# Patient Record
Sex: Male | Born: 1985 | Race: White | Hispanic: No | Marital: Single | Smoking: Current every day smoker
Health system: Southern US, Community
[De-identification: ages and names within clinical notes are randomized; demographics above are authoritative.]

---
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
## 2007-06-05 ENCOUNTER — Emergency Department (HOSPITAL_COMMUNITY): Admission: EM | Admit: 2007-06-05 | Discharge: 2007-06-05 | Payer: Self-pay | Admitting: Emergency Medicine

## 2008-04-27 ENCOUNTER — Emergency Department (HOSPITAL_COMMUNITY): Admission: EM | Admit: 2008-04-27 | Discharge: 2008-04-27 | Payer: Self-pay | Admitting: Emergency Medicine

## 2011-11-15 ENCOUNTER — Encounter (HOSPITAL_COMMUNITY): Payer: Self-pay | Admitting: Emergency Medicine

## 2011-11-15 ENCOUNTER — Emergency Department (HOSPITAL_COMMUNITY): Payer: Self-pay

## 2011-11-15 ENCOUNTER — Emergency Department (HOSPITAL_COMMUNITY)
Admission: EM | Admit: 2011-11-15 | Discharge: 2011-11-15 | Disposition: A | Discharge status: Home / Self Care | Payer: Self-pay | Attending: Emergency Medicine | Admitting: Emergency Medicine

## 2011-11-15 DIAGNOSIS — M5416 Radiculopathy, lumbar region: Secondary | ICD-10-CM

## 2011-11-15 DIAGNOSIS — IMO0002 Reserved for concepts with insufficient information to code with codable children: Secondary | ICD-10-CM | POA: Insufficient documentation

## 2011-11-15 DIAGNOSIS — M25559 Pain in unspecified hip: Secondary | ICD-10-CM | POA: Insufficient documentation

## 2011-11-15 MED ORDER — CYCLOBENZAPRINE HCL 10 MG PO TABS: 10.0000 mg | ORAL_TABLET | Freq: Two times a day (BID) | ORAL | Status: AC | PRN

## 2011-11-15 MED ORDER — HYDROCODONE-ACETAMINOPHEN 5-500 MG PO TABS: 1.0000 | ORAL_TABLET | Freq: Four times a day (QID) | ORAL | Status: AC | PRN

## 2011-11-15 MED ORDER — CYCLOBENZAPRINE HCL 10 MG PO TABS
10.0000 mg | ORAL_TABLET | Freq: Once | ORAL | Status: AC
  Administered 2011-11-15: 10 mg via ORAL
  Filled 2011-11-15: qty 1

## 2011-11-15 MED ORDER — KETOROLAC TROMETHAMINE 60 MG/2ML IM SOLN
60.0000 mg | Freq: Once | INTRAMUSCULAR | Status: AC
  Administered 2011-11-15: 60 mg via INTRAMUSCULAR
  Filled 2011-11-15: qty 2

## 2011-11-15 NOTE — ED Provider Notes (Addendum)
History   This chart was scribed for Gwyneth Sprout, MD by Brooks Sailors. The patient was seen in room STRE2/STRE2. Patient's care was started at 1144.   CSN: [id number]  Arrival date & time 11/15/11  1144   First MD Initiated Contact with Patient 11/15/11 1154      Chief Complaint  Patient presents with  . Back Pain    (Consider location/radiation/quality/duration/timing/severity/associated sxs/prior treatment) HPI  Rick Ortega is a 26 y.o. male who presents to the Emergency Department complaining of constant severe right side back pain onset 45 minutes ago. Pt says he was moving mattress over his shoulders when he felt a shooting pain from his right lower back down to his right toes. Course is constant. Nothing makes pain worse and nothing makes pain better. Denies GU/GI problems, abdominal pain. Denies history of back problems and has not taken anything for pain.    History reviewed. No pertinent past medical history.  History reviewed. No pertinent past surgical history.  History reviewed. No pertinent family history.  History  Substance Use Topics  . Smoking status: Current Everyday Smoker  . Smokeless tobacco: Not on file  . Alcohol Use: Yes     occasional      Review of Systems  Musculoskeletal: Positive for back pain.  All other systems reviewed and are negative.    Allergies  Review of patient's allergies indicates no known allergies.  Home Medications  No current outpatient prescriptions on file.  BP 157/83  Pulse 97  Temp(Src) 98.2 F (36.8 C) (Oral)  Resp 18  Wt 160 lb (72.576 kg)  SpO2 100%  Physical Exam  Nursing note and vitals reviewed. Constitutional: He is oriented to person, place, and time. He appears well-developed and well-nourished. No distress.  HENT:  Head: Normocephalic and atraumatic.  Eyes: EOM are normal. Pupils are equal, round, and reactive to light.  Neck: Neck supple. No tracheal deviation present.    Cardiovascular: Normal rate and normal heart sounds.   Pulmonary/Chest: Effort normal. No respiratory distress. He has no wheezes.  Abdominal: Soft. Bowel sounds are normal. There is no tenderness.  Musculoskeletal: Normal range of motion. He exhibits tenderness.       Severe tenderness lumbar region, Normal strength, normal reflexes and pulses, Full ROM in both lower extremities without pain.  Neurological: He is alert and oriented to person, place, and time. He has normal strength and normal reflexes. No sensory deficit.  Skin: Skin is warm and dry.  Psychiatric: He has a normal mood and affect. His behavior is normal.    ED Course  Procedures (including critical care time)  Pt seen at 1157. Pt to have muscle relaxer and pain medication.   Labs Reviewed - No data to display Dg Lumbar Spine Complete  11/15/2011  *RADIOLOGY REPORT*  Clinical Data: Back pain.  Left hip pain.  LUMBAR SPINE - COMPLETE 4+ VIEW  Comparison: None.  Findings: Lumbar vertebral alignment appears normal.  No lumbar spine fracture or acute subluxation is identified.  No acute lumbar spine findings noted.  IMPRESSION:  No significant abnormality identified.  Original Report Authenticated By: Dellia Cloud, M.D.     1. Lumbar radiculopathy       MDM   Pt with abrupt onset of back pain suggestive of radiculopathy down the left leg that started while he was lifting a mattress.  No neurovascular compromise and no incontinence.  Pt has no infectious sx, hx of CA  or other red flags  concerning for pathologic back pain.  Pt is able to ambulate but is painful.  Normal strength and reflexes on exam.  No pain with ROM of the legs.  Plain film wnl. Will give pt pain control and to return for developement of above sx.      I personally performed the services described in this documentation, which was scribed in my presence.  The recorded information has been reviewed and considered.    Gwyneth Sprout,  MD 11/15/11 1205  Gwyneth Sprout, MD 11/15/11 1300  Gwyneth Sprout, MD 11/15/11 1327  Gwyneth Sprout, MD 11/15/11 1329  Gwyneth Sprout, MD 11/15/11 1329

## 2011-11-15 NOTE — ED Notes (Signed)
Patient in xray 

## 2011-11-15 NOTE — ED Notes (Signed)
Pt c/o lower back pain after moving mattress today; pt sts pain radiates down left leg

## 2011-11-15 NOTE — Discharge Instructions (Signed)

## 2012-03-25 ENCOUNTER — Emergency Department (HOSPITAL_COMMUNITY): Payer: Self-pay

## 2012-03-25 ENCOUNTER — Emergency Department (HOSPITAL_COMMUNITY)
Admission: EM | Admit: 2012-03-25 | Discharge: 2012-03-25 | Disposition: A | Discharge status: Home / Self Care | Payer: Self-pay | Attending: Emergency Medicine | Admitting: Emergency Medicine

## 2012-03-25 ENCOUNTER — Encounter (HOSPITAL_COMMUNITY): Payer: Self-pay | Admitting: *Deleted

## 2012-03-25 DIAGNOSIS — T07XXXA Unspecified multiple injuries, initial encounter: Secondary | ICD-10-CM

## 2012-03-25 DIAGNOSIS — S41109A Unspecified open wound of unspecified upper arm, initial encounter: Secondary | ICD-10-CM | POA: Insufficient documentation

## 2012-03-25 DIAGNOSIS — W268XXA Contact with other sharp object(s), not elsewhere classified, initial encounter: Secondary | ICD-10-CM | POA: Insufficient documentation

## 2012-03-25 DIAGNOSIS — S21109A Unspecified open wound of unspecified front wall of thorax without penetration into thoracic cavity, initial encounter: Secondary | ICD-10-CM | POA: Insufficient documentation

## 2012-03-25 DIAGNOSIS — F17200 Nicotine dependence, unspecified, uncomplicated: Secondary | ICD-10-CM | POA: Insufficient documentation

## 2012-03-25 DIAGNOSIS — S81009A Unspecified open wound, unspecified knee, initial encounter: Secondary | ICD-10-CM | POA: Insufficient documentation

## 2012-03-25 DIAGNOSIS — S61409A Unspecified open wound of unspecified hand, initial encounter: Secondary | ICD-10-CM | POA: Insufficient documentation

## 2012-03-25 DIAGNOSIS — Z23 Encounter for immunization: Secondary | ICD-10-CM | POA: Insufficient documentation

## 2012-03-25 MED ORDER — CEPHALEXIN 500 MG PO CAPS: 500.0000 mg | ORAL_CAPSULE | Freq: Four times a day (QID) | ORAL | Status: DC

## 2012-03-25 MED ORDER — BACITRACIN ZINC 500 UNIT/GM EX OINT
1.0000 "application " | TOPICAL_OINTMENT | Freq: Once | CUTANEOUS | Status: AC
  Administered 2012-03-25: 1 via TOPICAL
  Filled 2012-03-25: qty 15

## 2012-03-25 MED ORDER — TETANUS-DIPHTH-ACELL PERTUSSIS 5-2.5-18.5 LF-MCG/0.5 IM SUSP
0.5000 mL | Freq: Once | INTRAMUSCULAR | Status: AC
  Administered 2012-03-25: 0.5 mL via INTRAMUSCULAR
  Filled 2012-03-25: qty 0.5

## 2012-03-25 MED ORDER — NAPROXEN 500 MG PO TABS: 500.0000 mg | ORAL_TABLET | Freq: Two times a day (BID) | ORAL | Status: DC

## 2012-03-25 NOTE — ED Notes (Signed)
Pt in [employer] custody 
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
## 2012-03-25 NOTE — ED Notes (Signed)
The patient is AOx4 and comfortable with his discharge instructions.  The patient was released to [employer].
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
## 2013-05-21 IMAGING — CR DG ANKLE 2V *L*
2 series · 2 of 2 positions shown · non-contrast
Comparison: None.

CLINICAL DATA: Laceration

LEFT ANKLE - 2 VIEW

[view not recorded (1 of 2)]
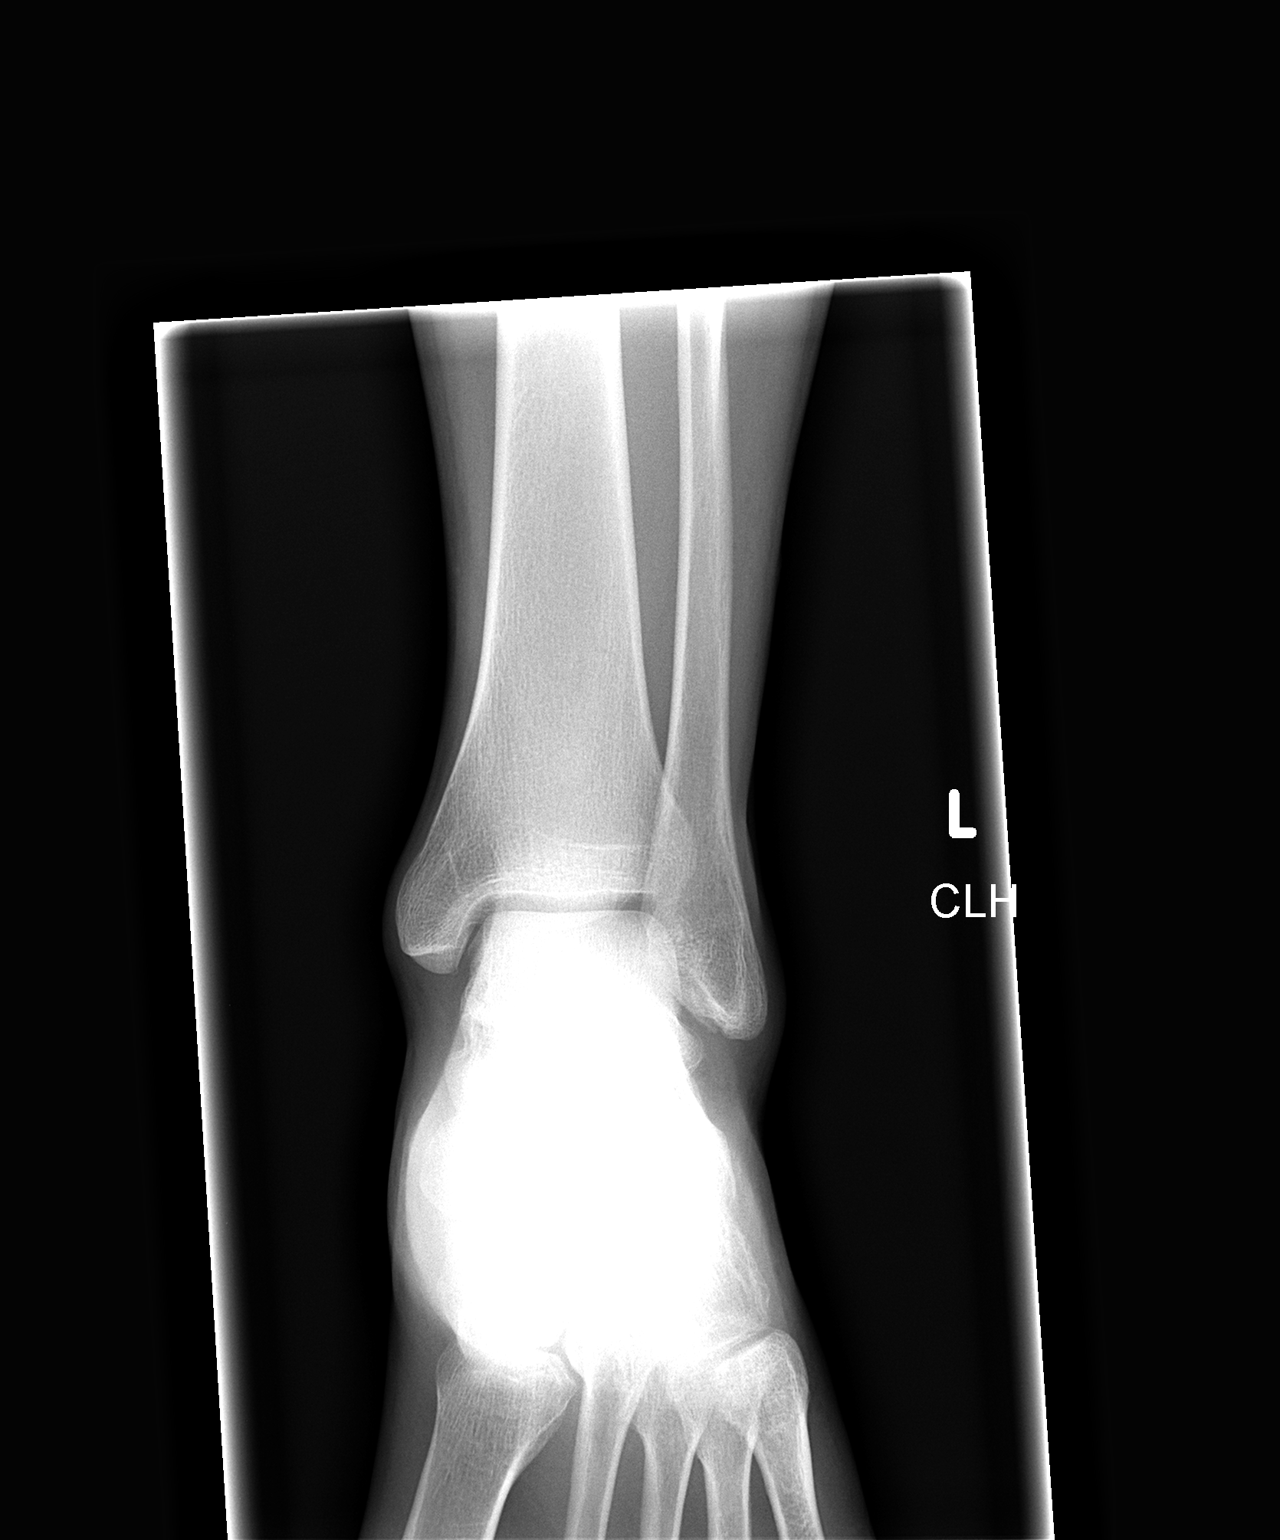

[view not recorded (2 of 2)]
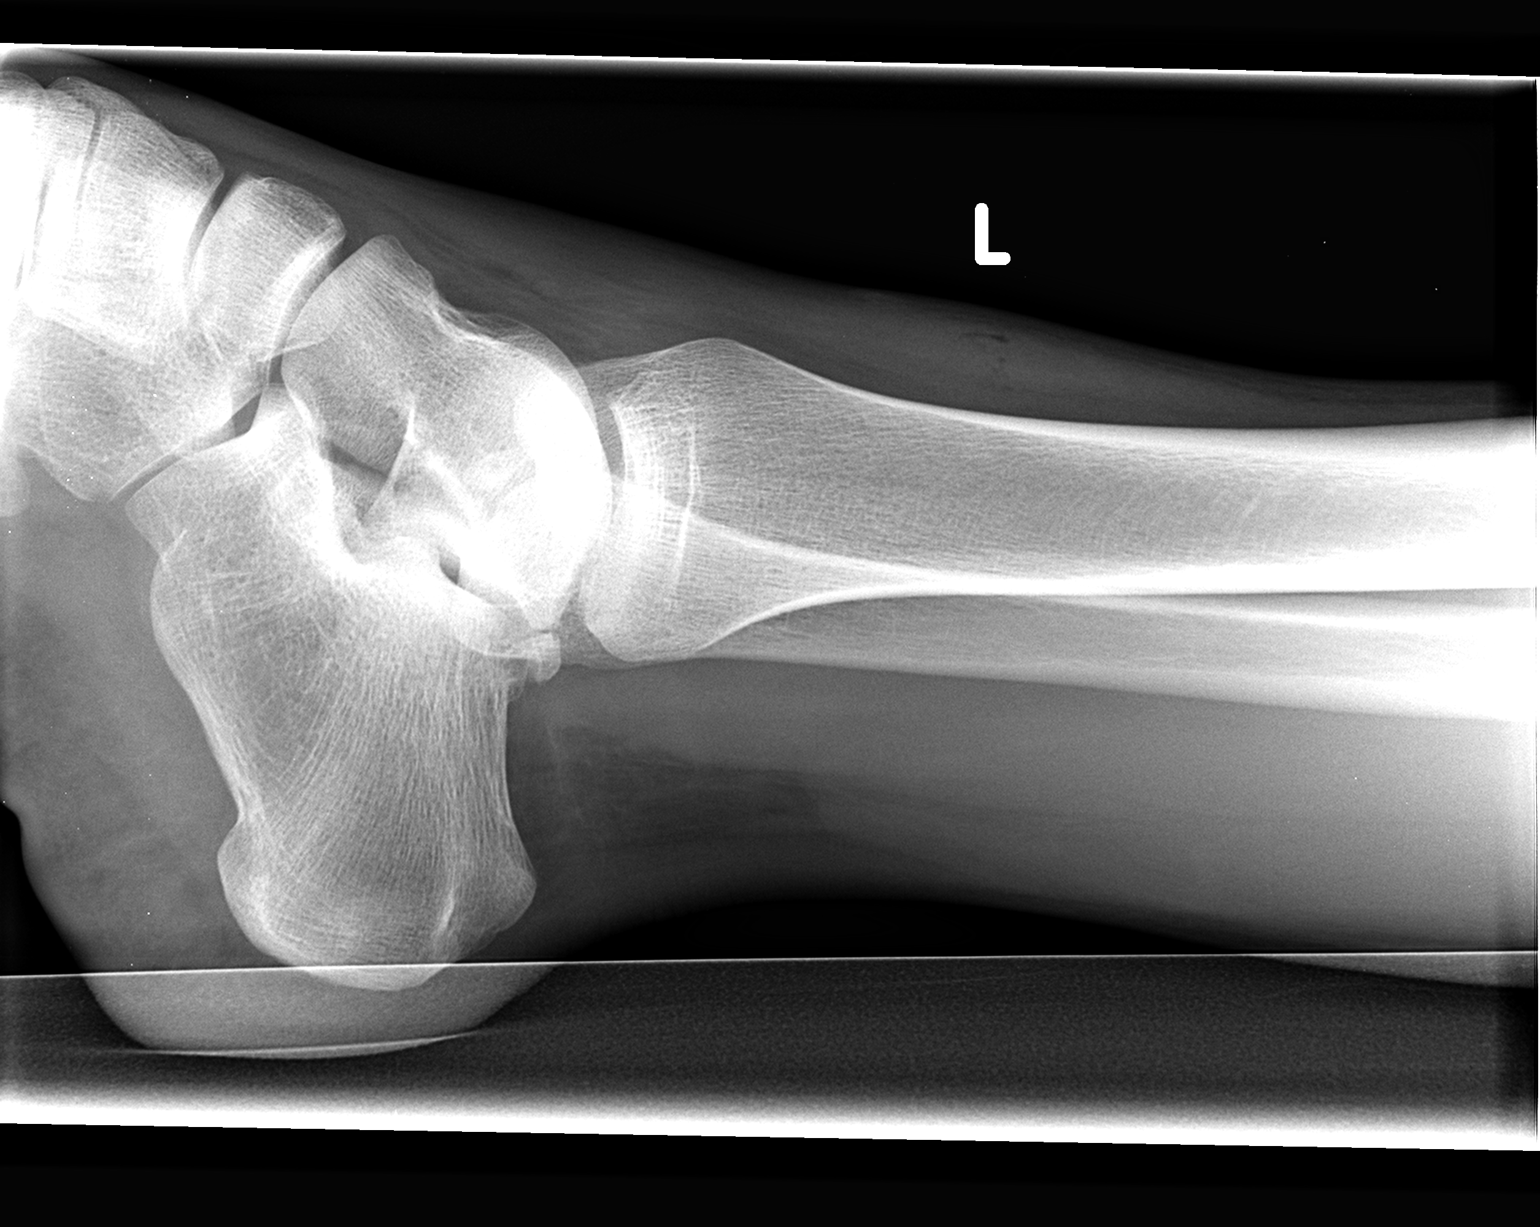

[2 of 2 positions shown; findings below may reference images not displayed]

FINDINGS: No radiodense foreign body or subcutaneous gas.  Ankle
mortise intact. Negative for fracture, dislocation, or other acute
abnormality.  Normal alignment and mineralization. No significant
degenerative change.  Regional soft tissues unremarkable.
IMPRESSION: Negative

## 2013-05-21 IMAGING — CR DG CHEST 2V
2 series · 2 of 2 positions shown · non-contrast
Comparison: None.

CLINICAL DATA: Puncture wound

CHEST - 2 VIEW

[w chest pa]
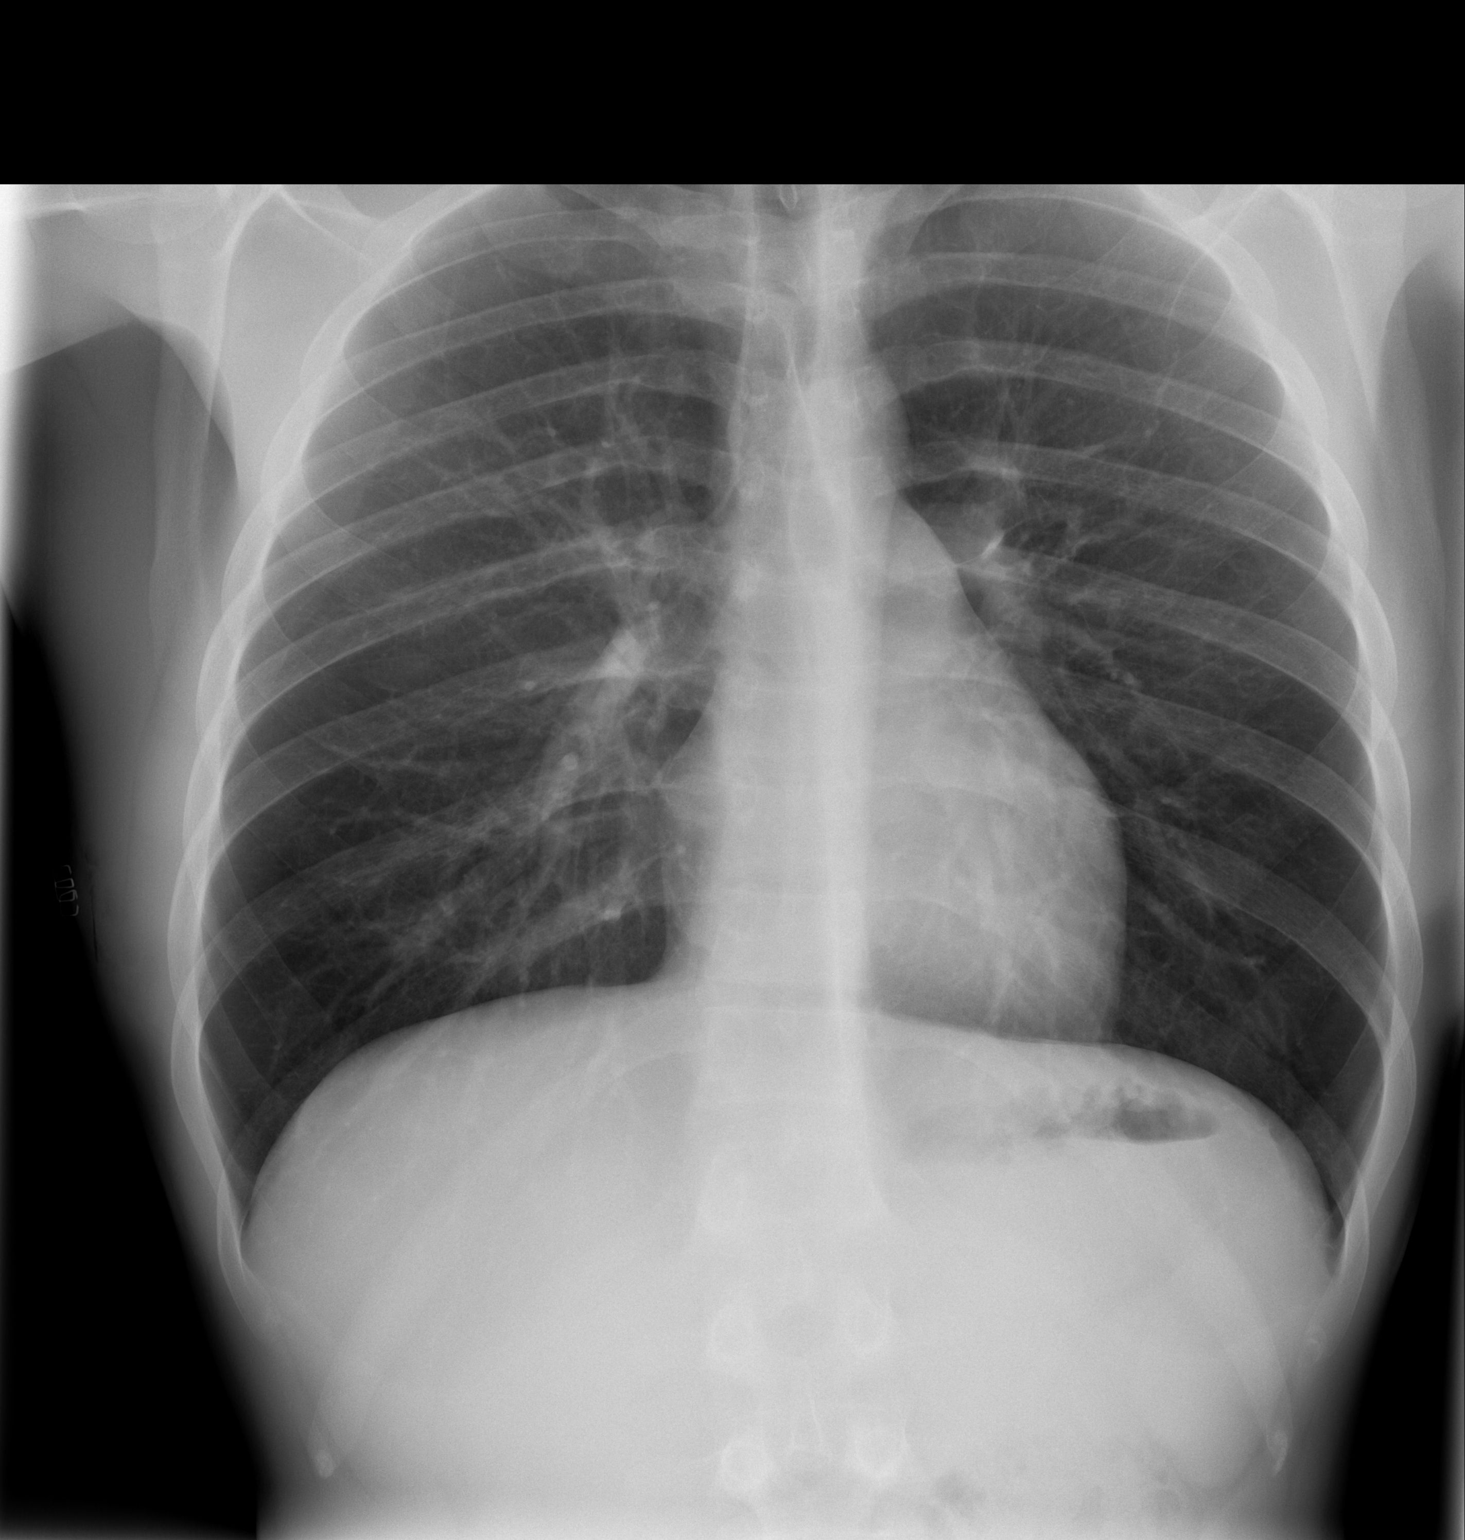

[w chest lat]
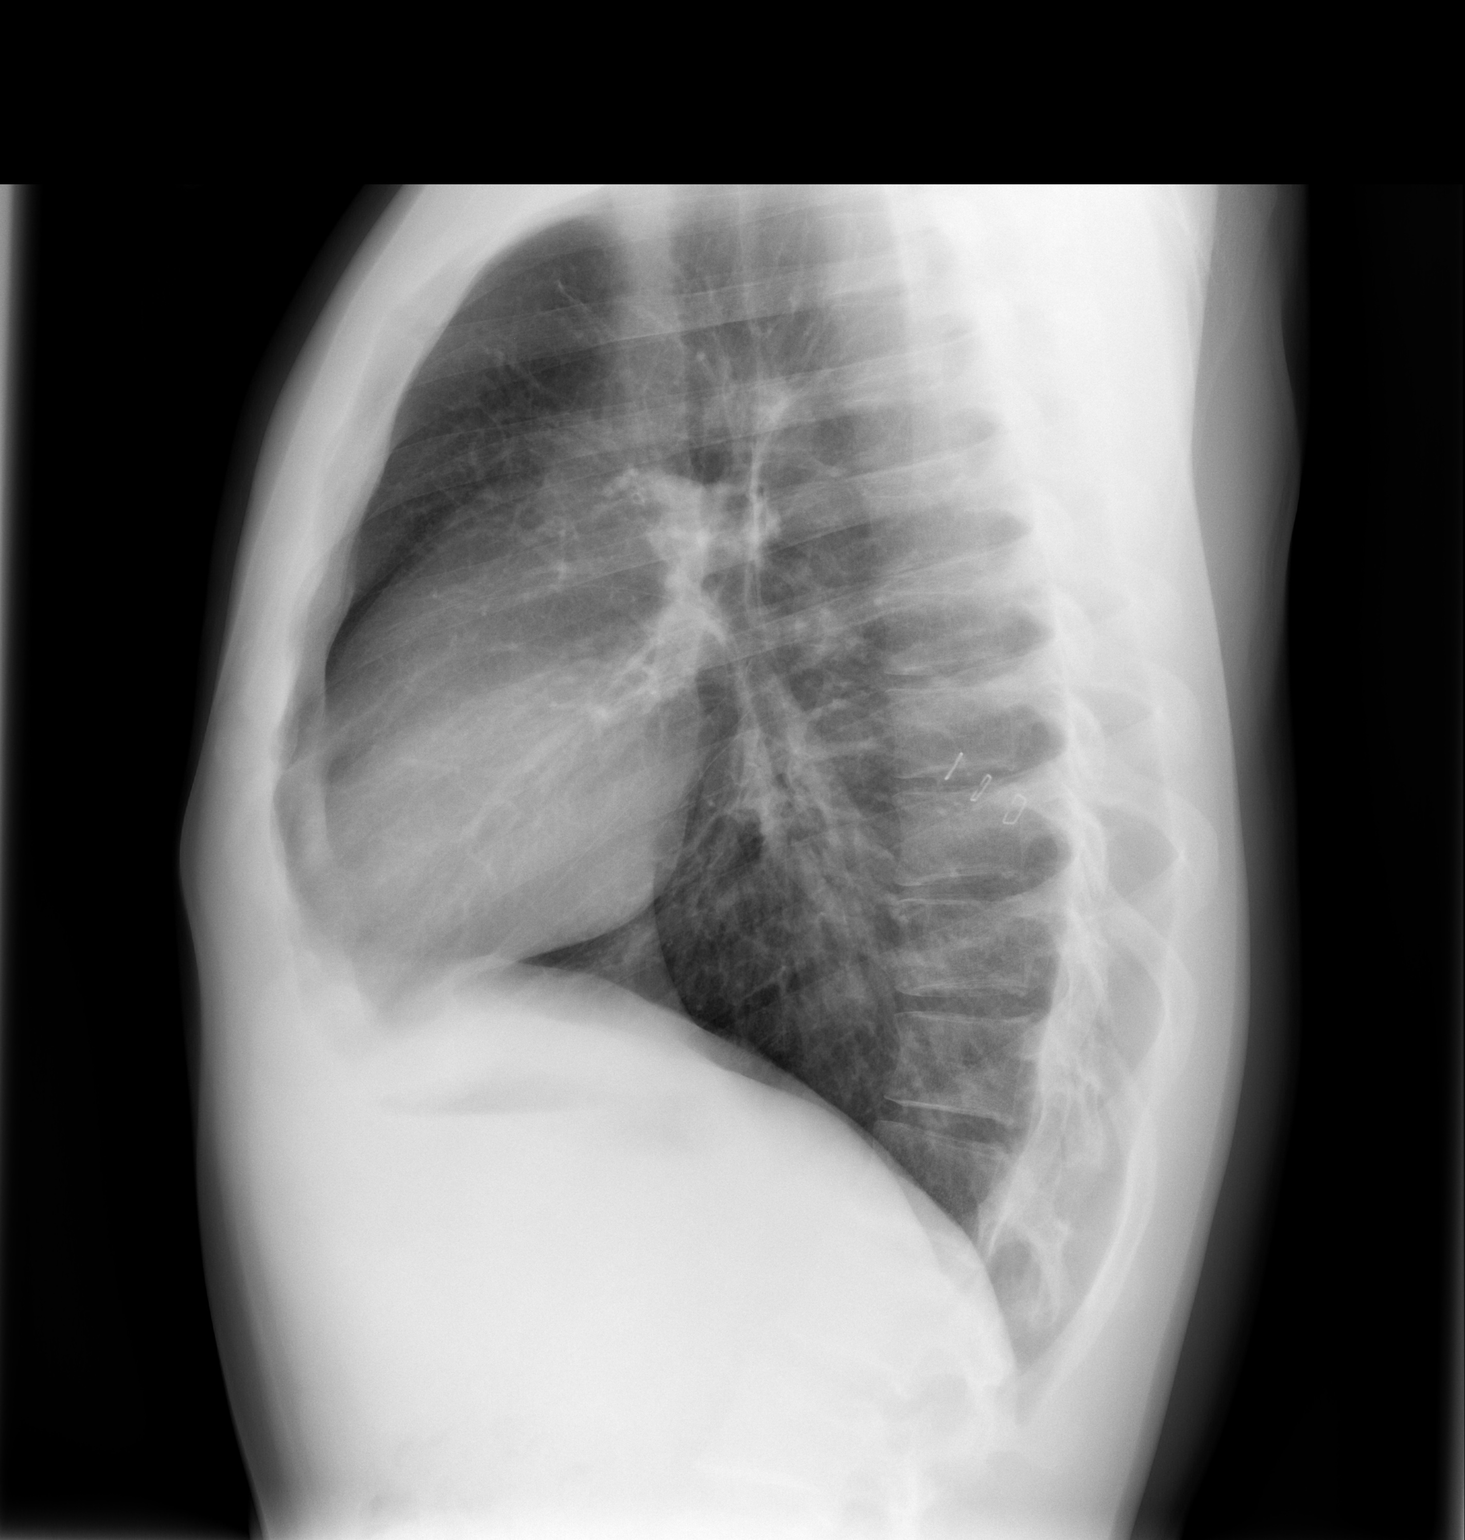

[2 of 2 positions shown; findings below may reference images not displayed]

FINDINGS: Skin staples are noted at the right lateral aspect of the
chest.  No pneumothorax. Lungs clear.  Heart size and pulmonary
vascularity normal.  No effusion.  Visualized bones unremarkable.
IMPRESSION: No acute disease

## 2017-01-26 DIAGNOSIS — M79675 Pain in left toe(s): Secondary | ICD-10-CM | POA: Diagnosis not present

## 2017-02-18 ENCOUNTER — Ambulatory Visit (INDEPENDENT_AMBULATORY_CARE_PROVIDER_SITE_OTHER): Payer: BLUE CROSS/BLUE SHIELD | Admitting: Podiatry

## 2017-02-18 ENCOUNTER — Encounter: Payer: Self-pay | Admitting: Podiatry

## 2017-02-18 VITALS — BP 141/97 | HR 73 | Resp 16

## 2017-02-18 DIAGNOSIS — L600 Ingrowing nail: Secondary | ICD-10-CM

## 2017-02-18 NOTE — Progress Notes (Signed)
  Subjective:  Patient ID: Rick Ortega, male    DOB: 1986/02/05,  MRN: [id number] HPI Chief Complaint  Patient presents with  . Toe Pain    Hallux left - medial border, intermittent tenderness x 1 year, went to get pedicure and noticed few months after getting sore, tried trimming   31 y.o. male presents with the above complaint. Reports he has had chronic issues with the L great toenail. Attempts self care. States that is gets very sore while he works in his work boots after a few hours. Denies other pedal issues.  No past medical history on file. No past surgical history on file. No current outpatient prescriptions on file.  Allergies  Allergen Reactions  . No Known Allergies    Review of Systems  All other systems reviewed and are negative.  Objective:   Vitals:   02/18/17 0808  BP: (!) 141/97  Pulse: 73  Resp: 16   General AA&O x3. Normal mood and affect.  Vascular Dorsalis pedis and posterior tibial pulses  present 2+ bilaterally  Capillary refill normal to all digits. Pedal hair growth normal.  Neurologic Epicritic sensation present bilaterally.  Dermatologic No open lesions. Interspaces clear of maceration.  Normal skin temperature and turgor. Painful ingrowing nail at medial nail borders of the hallux nail left.  Orthopedic: MMT 5/5 in dorsiflexion, plantarflexion, inversion, and eversion. Normal lower extremity joint ROM without pain or crepitus.    Assessment & Plan:  Ingrown Nail, left -Patient elects to proceed with ingrown toenail removal today. Consent signed. -Ingrown nail excised. See procedure note. -Educated on post-procedure care including soaking. Written instructions provided. -Patient to follow up in 2 weeks for nail check.  Procedure: Excision of Ingrown Toenail Location: Left 1st toe medial nail borders. Anesthesia: Lidocaine 1% plain; 1.515mL and Marcaine 0.5% plain; 1.515mL, digital block. Skin Prep: Betadine. Dressing: Silvadene; telfa;  dry, sterile, compression dressing. Technique: Following skin prep, the toe was exsanguinated and a tourniquet was secured at the base of the toe. The affected nail border was freed, split with a nail splitter, and excised. Chemical matrixectomy was then performed with phenol and irrigated out with alcohol. The tourniquet was then removed and sterile dressing applied. Disposition: Patient tolerated procedure well. Patient to return in 2 weeks for follow-up.  Return in about 2 weeks (around 03/04/2017).

## 2017-02-18 NOTE — Patient Instructions (Signed)

## 2017-03-04 ENCOUNTER — Ambulatory Visit: Payer: BLUE CROSS/BLUE SHIELD | Admitting: Podiatry

## 2017-03-29 DIAGNOSIS — R079 Chest pain, unspecified: Secondary | ICD-10-CM | POA: Diagnosis not present

## 2017-03-29 DIAGNOSIS — F418 Other specified anxiety disorders: Secondary | ICD-10-CM | POA: Diagnosis not present

## 2017-03-29 DIAGNOSIS — M940 Chondrocostal junction syndrome [Tietze]: Secondary | ICD-10-CM | POA: Diagnosis not present

## 2017-06-26 DIAGNOSIS — R6889 Other general symptoms and signs: Secondary | ICD-10-CM | POA: Diagnosis not present

## 2018-09-15 DIAGNOSIS — J309 Allergic rhinitis, unspecified: Secondary | ICD-10-CM | POA: Diagnosis not present

## 2018-09-15 DIAGNOSIS — R05 Cough: Secondary | ICD-10-CM | POA: Diagnosis not present

## 2020-04-04 DIAGNOSIS — Z20822 Contact with and (suspected) exposure to covid-19: Secondary | ICD-10-CM | POA: Diagnosis not present

## 2022-10-06 ENCOUNTER — Emergency Department (HOSPITAL_BASED_OUTPATIENT_CLINIC_OR_DEPARTMENT_OTHER)
Admission: EM | Admit: 2022-10-06 | Discharge: 2022-10-06 | Disposition: A | Discharge status: Home / Self Care | Payer: BC Managed Care – PPO | Attending: Emergency Medicine | Admitting: Emergency Medicine

## 2022-10-06 ENCOUNTER — Other Ambulatory Visit: Payer: Self-pay

## 2022-10-06 ENCOUNTER — Encounter (HOSPITAL_BASED_OUTPATIENT_CLINIC_OR_DEPARTMENT_OTHER): Payer: Self-pay

## 2022-10-06 DIAGNOSIS — Z23 Encounter for immunization: Secondary | ICD-10-CM | POA: Insufficient documentation

## 2022-10-06 DIAGNOSIS — S01511A Laceration without foreign body of lip, initial encounter: Secondary | ICD-10-CM

## 2022-10-06 DIAGNOSIS — S0993XA Unspecified injury of face, initial encounter: Secondary | ICD-10-CM | POA: Diagnosis present

## 2022-10-06 DIAGNOSIS — W228XXA Striking against or struck by other objects, initial encounter: Secondary | ICD-10-CM | POA: Diagnosis not present

## 2022-10-06 MED ORDER — LIDOCAINE HCL (PF) 1 % IJ SOLN
5.0000 mL | Freq: Once | INTRAMUSCULAR | Status: AC
  Administered 2022-10-06: 5 mL
  Filled 2022-10-06: qty 5

## 2022-10-06 MED ORDER — AMOXICILLIN-POT CLAVULANATE 875-125 MG PO TABS
1.0000 | ORAL_TABLET | Freq: Once | ORAL | Status: AC
  Administered 2022-10-06: 1 via ORAL
  Filled 2022-10-06: qty 1

## 2022-10-06 MED ORDER — TETANUS-DIPHTH-ACELL PERTUSSIS 5-2.5-18.5 LF-MCG/0.5 IM SUSY
0.5000 mL | PREFILLED_SYRINGE | Freq: Once | INTRAMUSCULAR | Status: AC
  Administered 2022-10-06: 0.5 mL via INTRAMUSCULAR
  Filled 2022-10-06: qty 0.5

## 2022-10-06 MED ORDER — AMOXICILLIN-POT CLAVULANATE 875-125 MG PO TABS: 1.0000 | ORAL_TABLET | Freq: Two times a day (BID) | ORAL | 0 refills | Status: AC

## 2022-10-06 NOTE — Discharge Instructions (Signed)
Please try to keep the area clean and dry, you can use antibacterial mouthwash, to help with healing of the wound.  Additionally you can place bacitracin on the area.  The stitches will absorb itself, if you feel like the scar is very noticeable please seek a plastic surgeon for revision.  The wound on the right crease of your mouth will heal on its own.
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
## 2022-10-06 NOTE — ED Notes (Signed)
RN reviewed discharge instructions with pt. Pt verbalized understanding and had no further questions. VSS upon discharge.  
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
